# Patient Record
Sex: Female | Born: 1935 | Race: White | Hispanic: No
Health system: Southern US, Community
[De-identification: ages and names within clinical notes are randomized; demographics above are authoritative.]

---

## 2001-03-25 ENCOUNTER — Inpatient Hospital Stay (HOSPITAL_COMMUNITY): Admission: AD | Admit: 2001-03-25 | Discharge: 2001-03-28 | Payer: Self-pay | Admitting: Internal Medicine

## 2001-03-26 ENCOUNTER — Encounter: Payer: Self-pay | Admitting: Internal Medicine

## 2001-03-28 ENCOUNTER — Encounter: Payer: Self-pay | Admitting: Internal Medicine

## 2005-09-03 ENCOUNTER — Emergency Department: Payer: Self-pay | Admitting: General Practice

## 2007-10-30 ENCOUNTER — Inpatient Hospital Stay (HOSPITAL_COMMUNITY): Admission: EM | Admit: 2007-10-30 | Discharge: 2007-11-09 | Payer: Self-pay | Admitting: Emergency Medicine

## 2009-01-31 IMAGING — CR DG CHEST 1V PORT
1 series · 1 of 1 positions shown · non-contrast
Comparison: none

CLINICAL DATA: Fall, chest pain.
 PORTABLE CHEST - 1 VIEW:

[view not recorded]
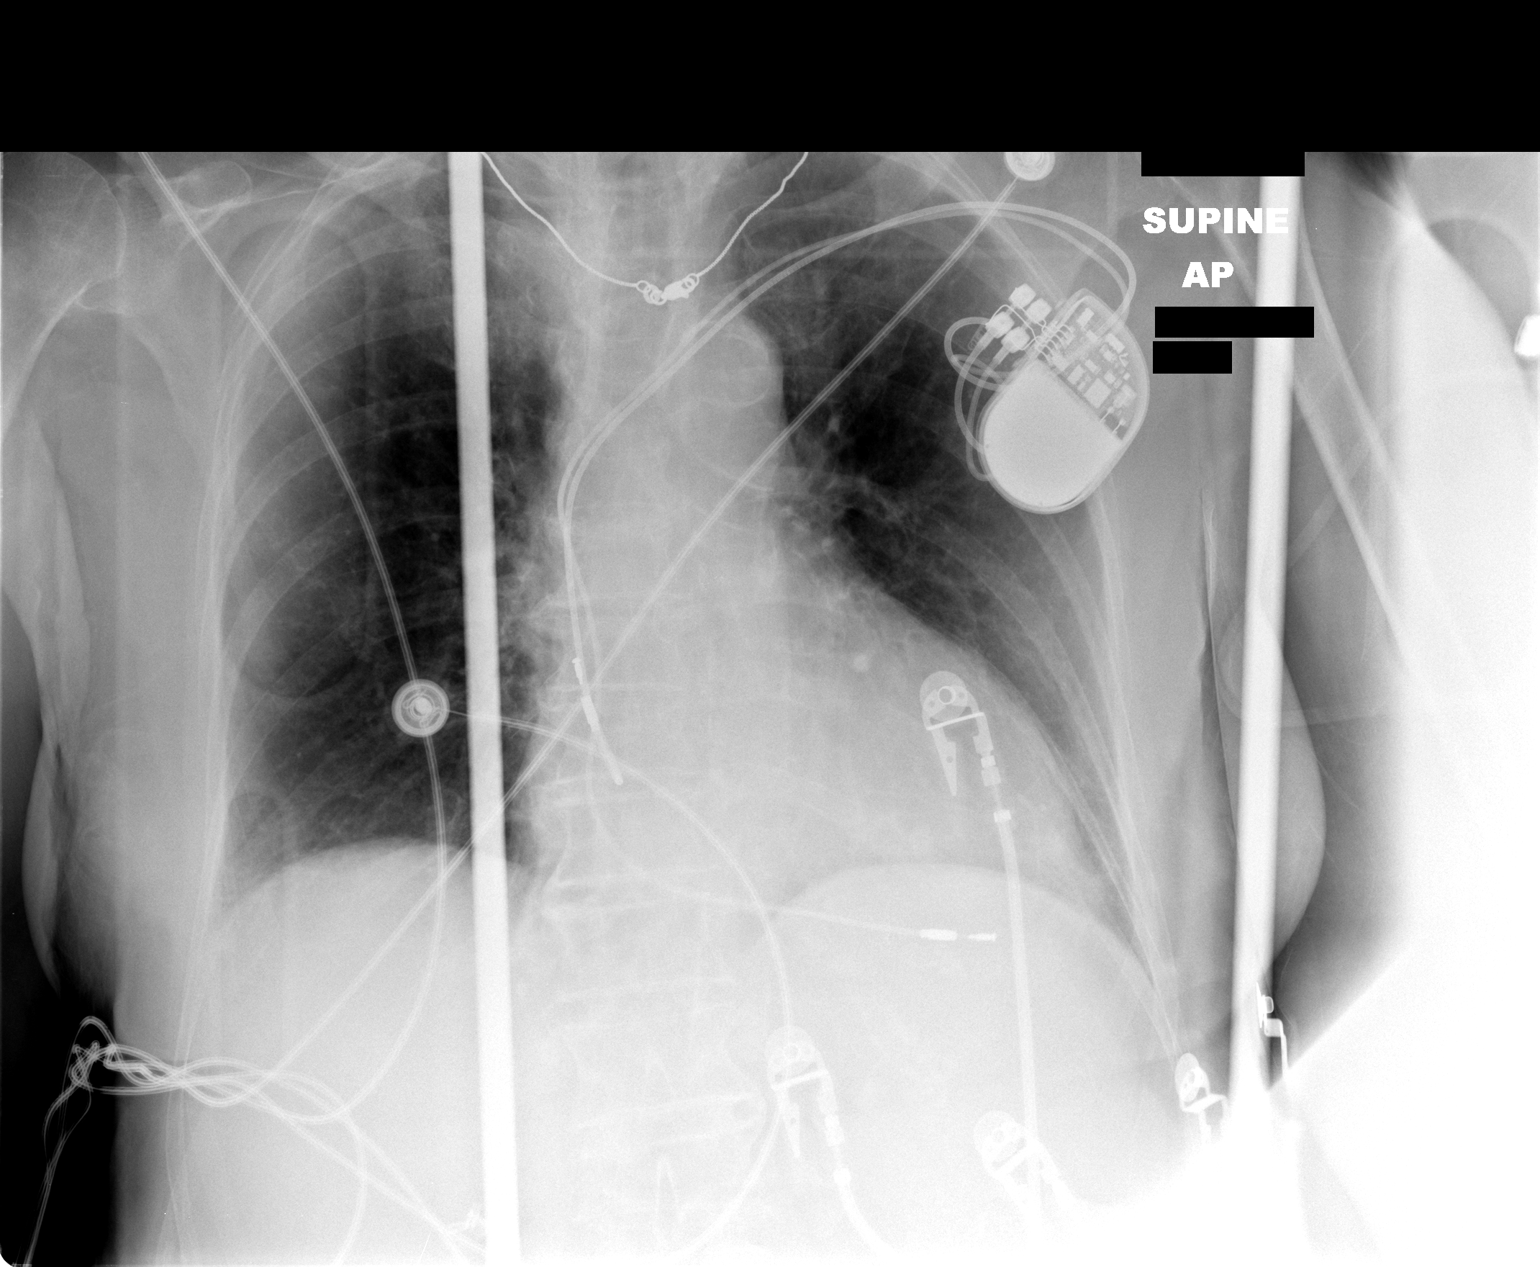

[1 of 1 positions shown; findings below may reference images not displayed]

FINDINGS: Backboard obscures some detail.
 Cardiomegaly is noted. There are fractures of the right 3rd and 4th ribs without definite pneumothorax or pleural effusions. The lungs are otherwise clear. Left-sided pacemaker is noted.
IMPRESSION: 1.  Right 3rd and 4th rib fractures without effusion or definite pneumothorax.
 2.  Cardiomegaly.

## 2009-01-31 IMAGING — CR DG PORTABLE PELVIS
1 series · 1 of 1 positions shown · non-contrast
Comparison: none

CLINICAL DATA: Fall. Pain.
 PORTABLE PELVIS ? 1 VIEW:

[view not recorded]
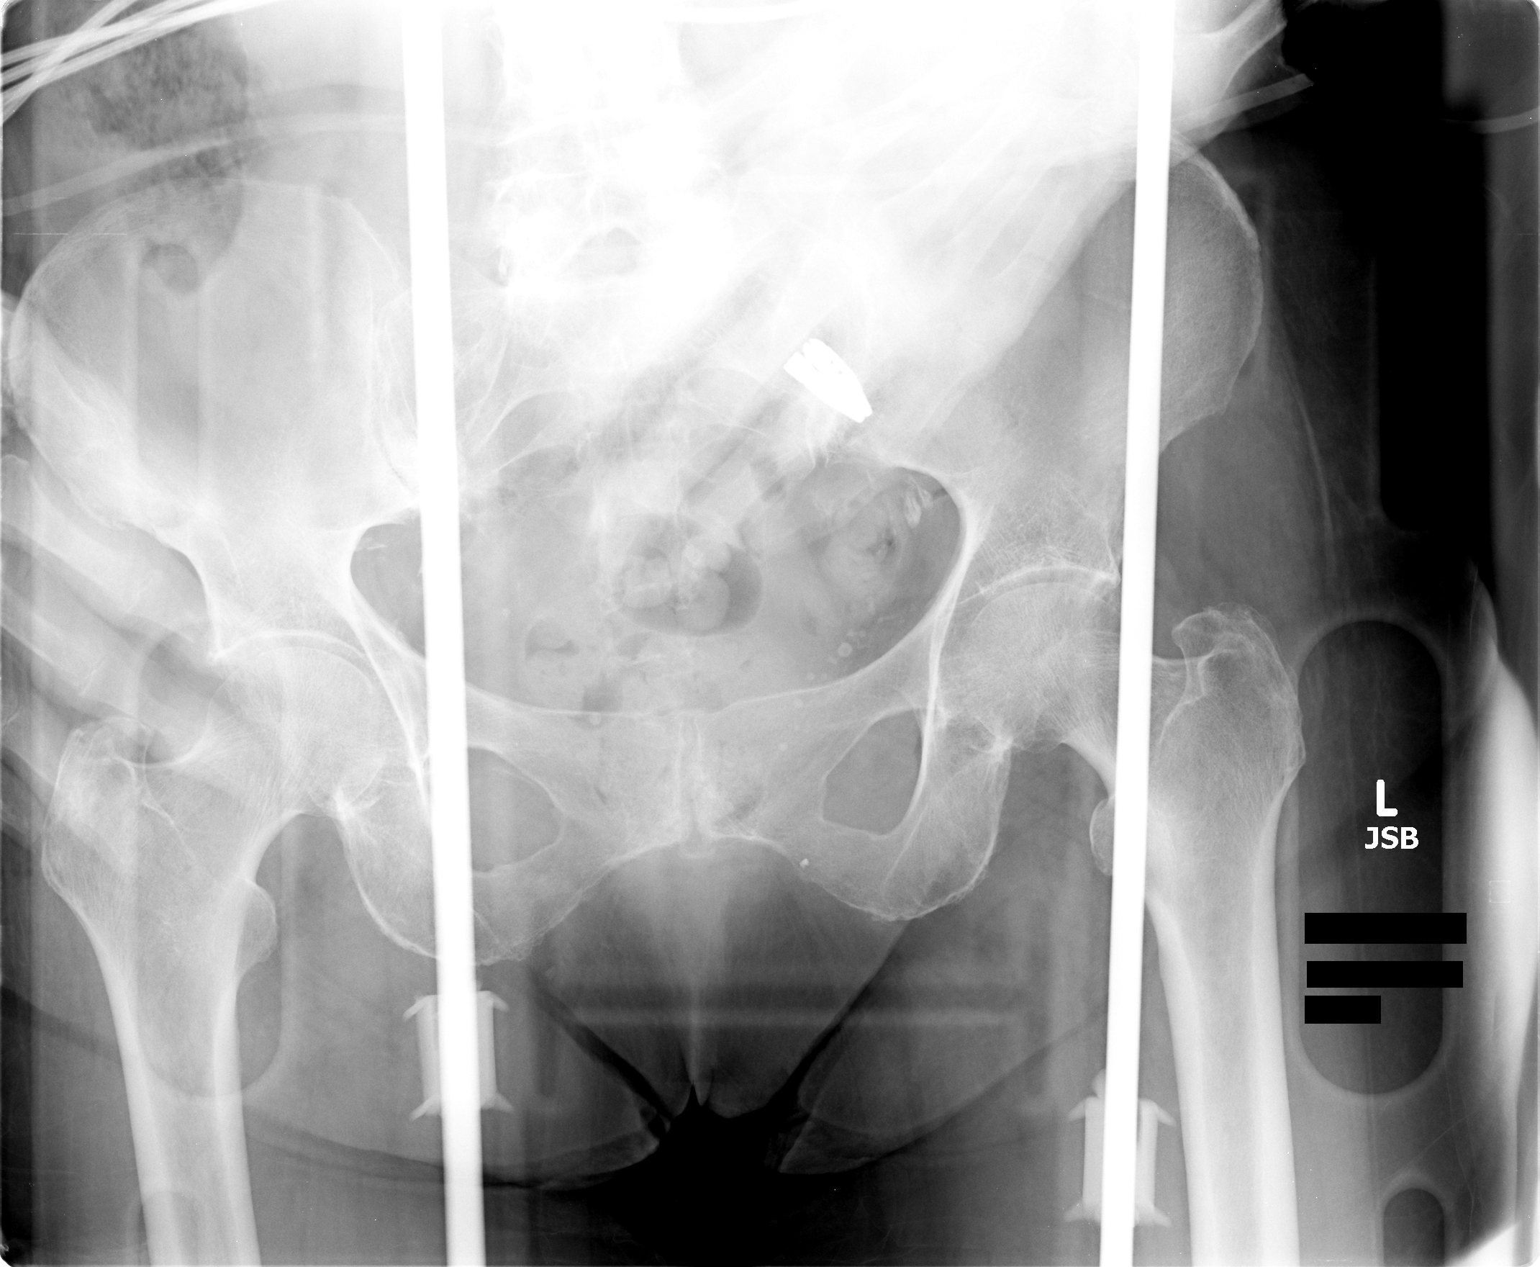

[1 of 1 positions shown; findings below may reference images not displayed]

FINDINGS: No definite acute abnormality identified including fracture, subluxation, or dislocation.  Diffuse osteopenia noted.
IMPRESSION: No acute abnormalities.

## 2009-01-31 IMAGING — CR DG ANKLE 2V *R*
2 series · 2 of 2 positions shown · non-contrast
Comparison: None.

PORTABLE RIGHT ANKLE - 3  VIEW:

CLINICAL DATA: Fall

[view not recorded (1 of 2)]
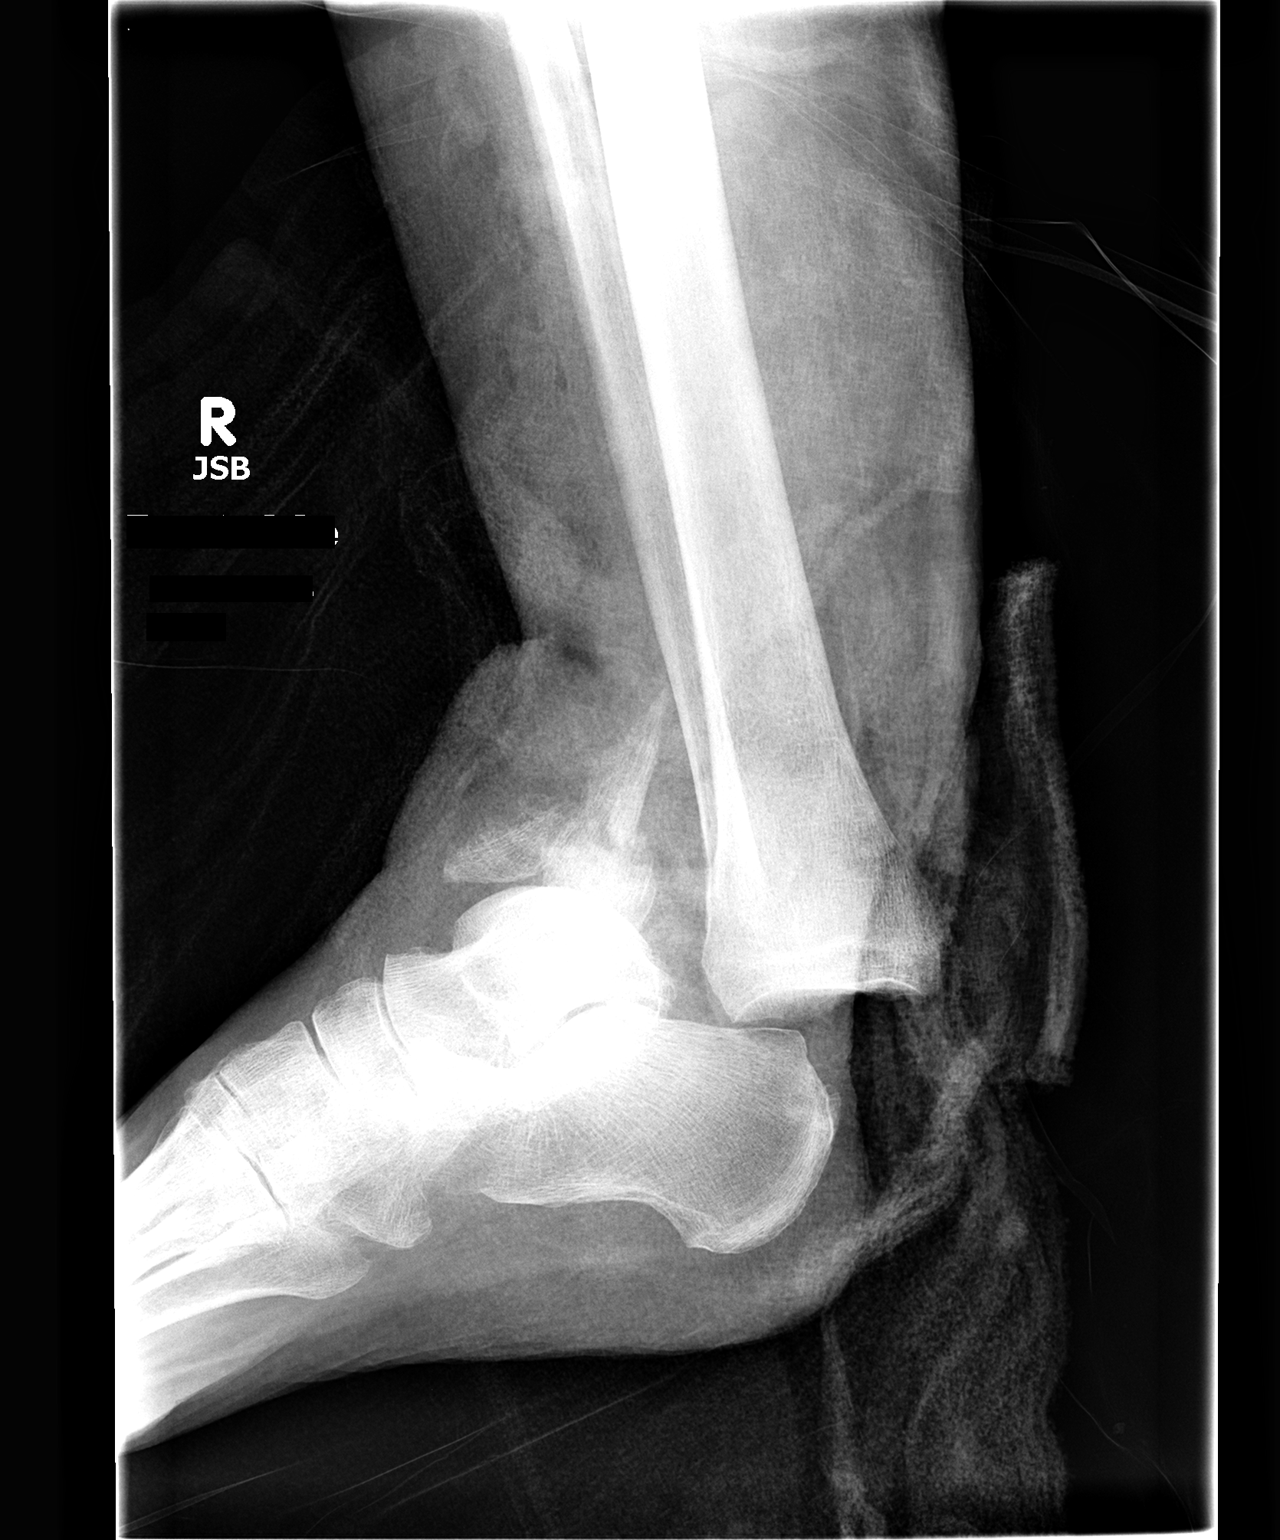

[view not recorded (2 of 2)]
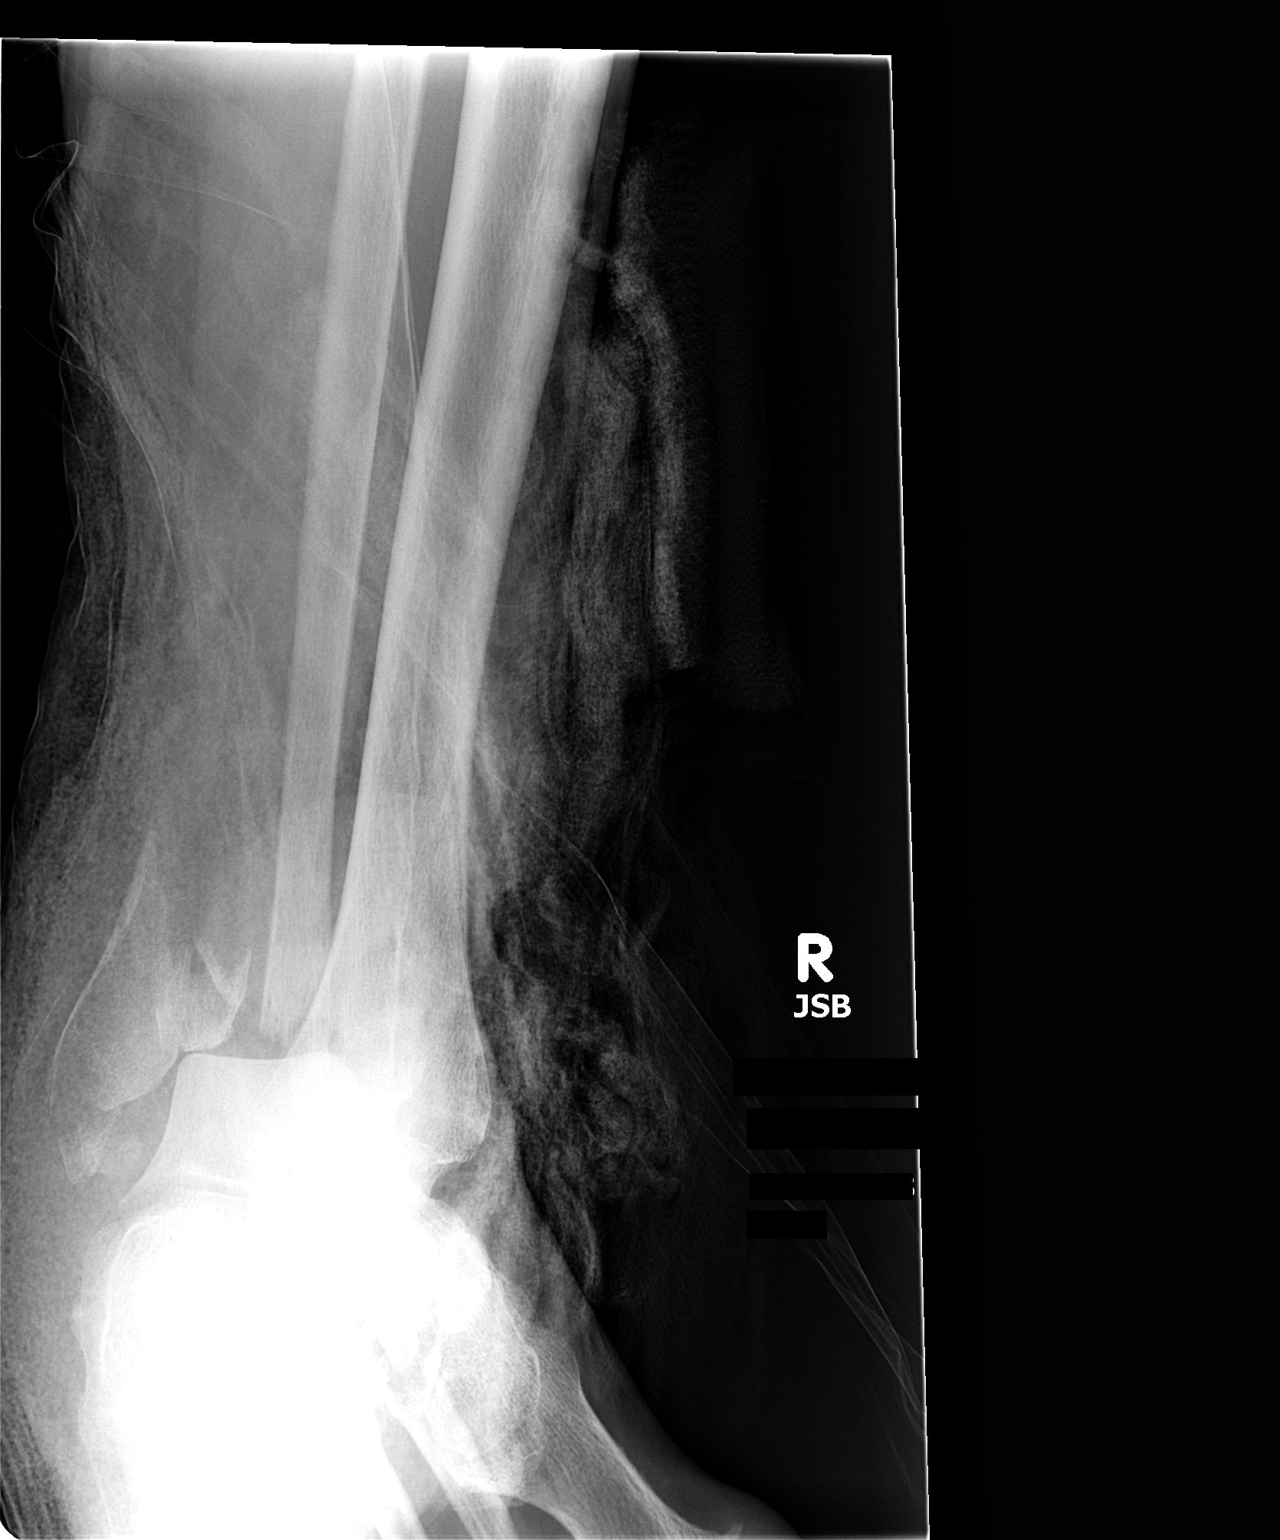

[2 of 2 positions shown; findings below may reference images not displayed]

FINDINGS: There is fracture-dislocation at the ankle with the posterior
dislocation of the tibia and fibula relative to the talus. There are fragments
of the distal tibia and lateral malleolus near their expected anatomic
locations.
IMPRESSION: Limited portable study shows comminuted fracture dislocation of the ankle with
posterior dislocation of the tibia and fibula relative to the talus.

## 2009-02-01 IMAGING — CR DG FOOT COMPLETE 3+V*L*
3 series · 3 of 3 positions shown · non-contrast
Comparison: None.

CLINICAL DATA: Right ankle trimalleolar fracture.  Bruising of the left foot 1st and 2nd digits. 
PORTABLE LEFT FOOT ? 3 VIEW:

[view not recorded (1 of 3)]
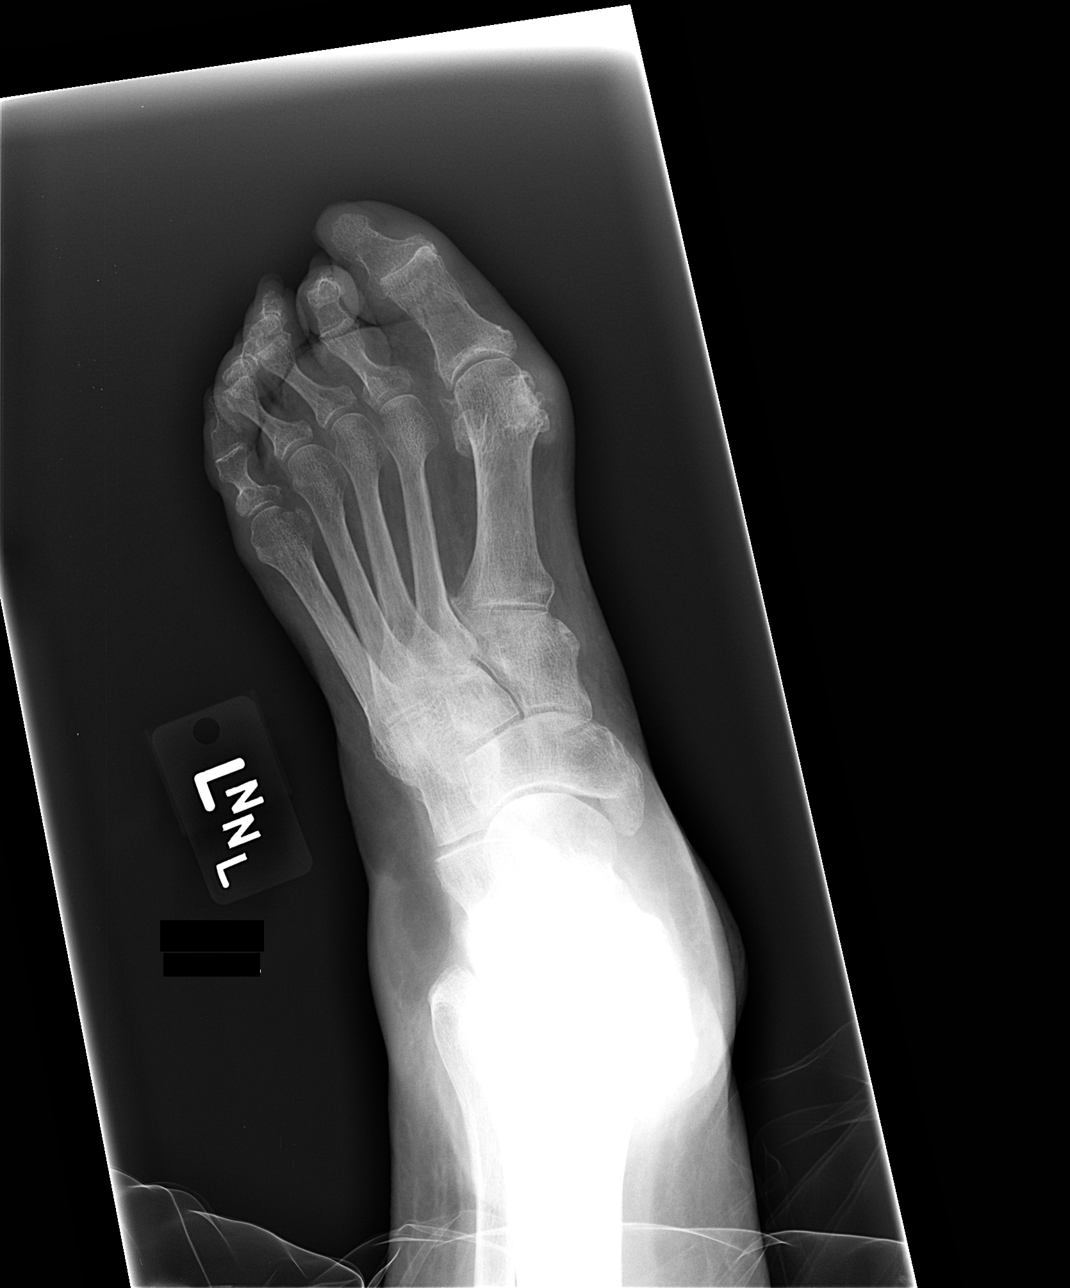

[view not recorded (2 of 3)]
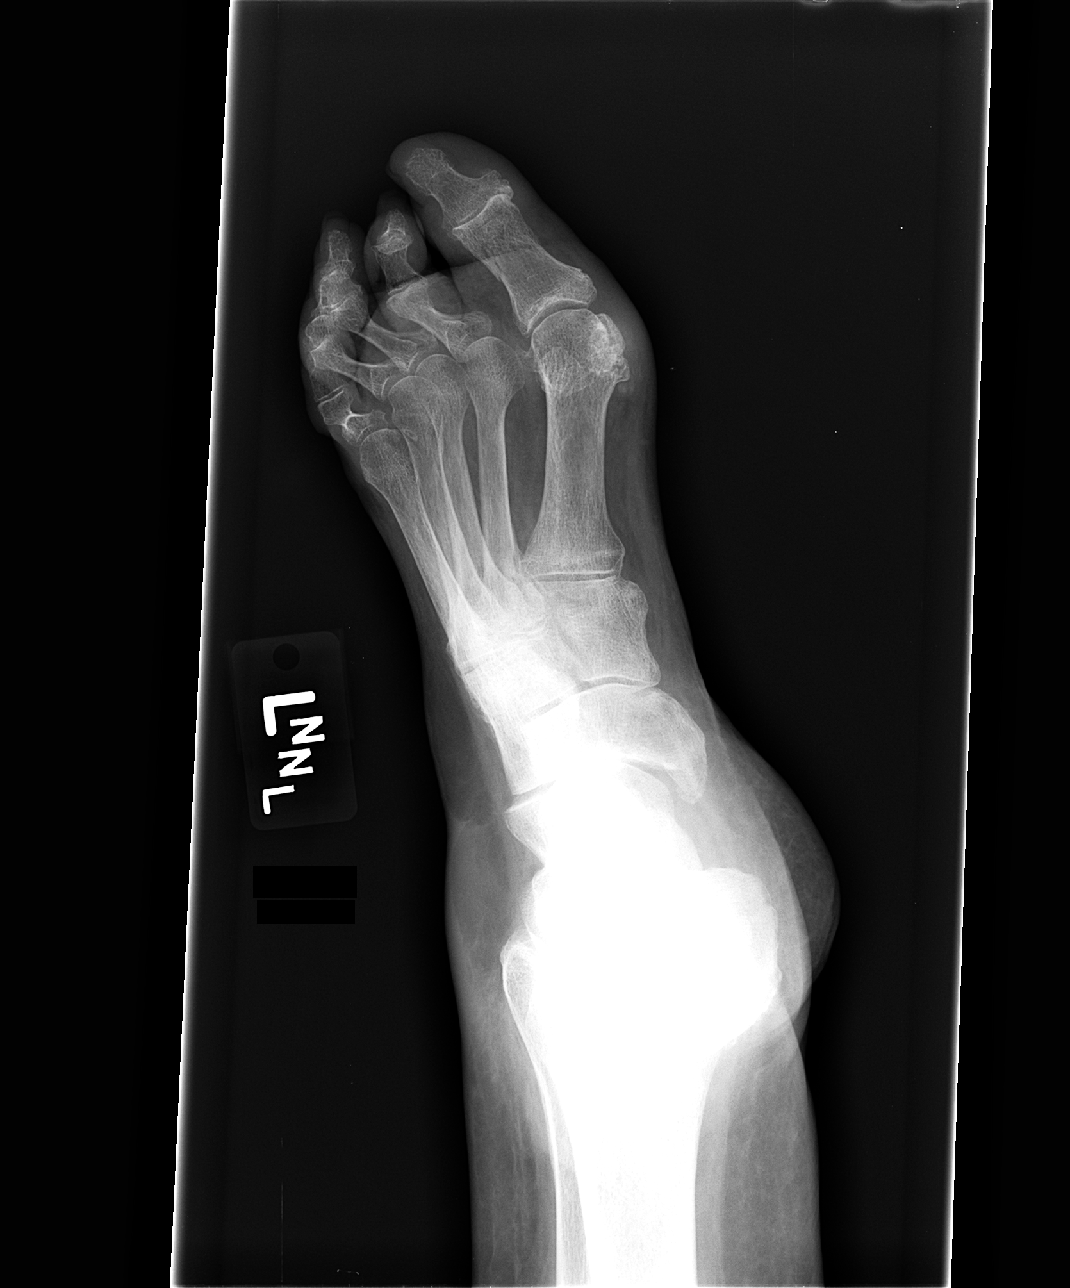

[view not recorded (3 of 3)]
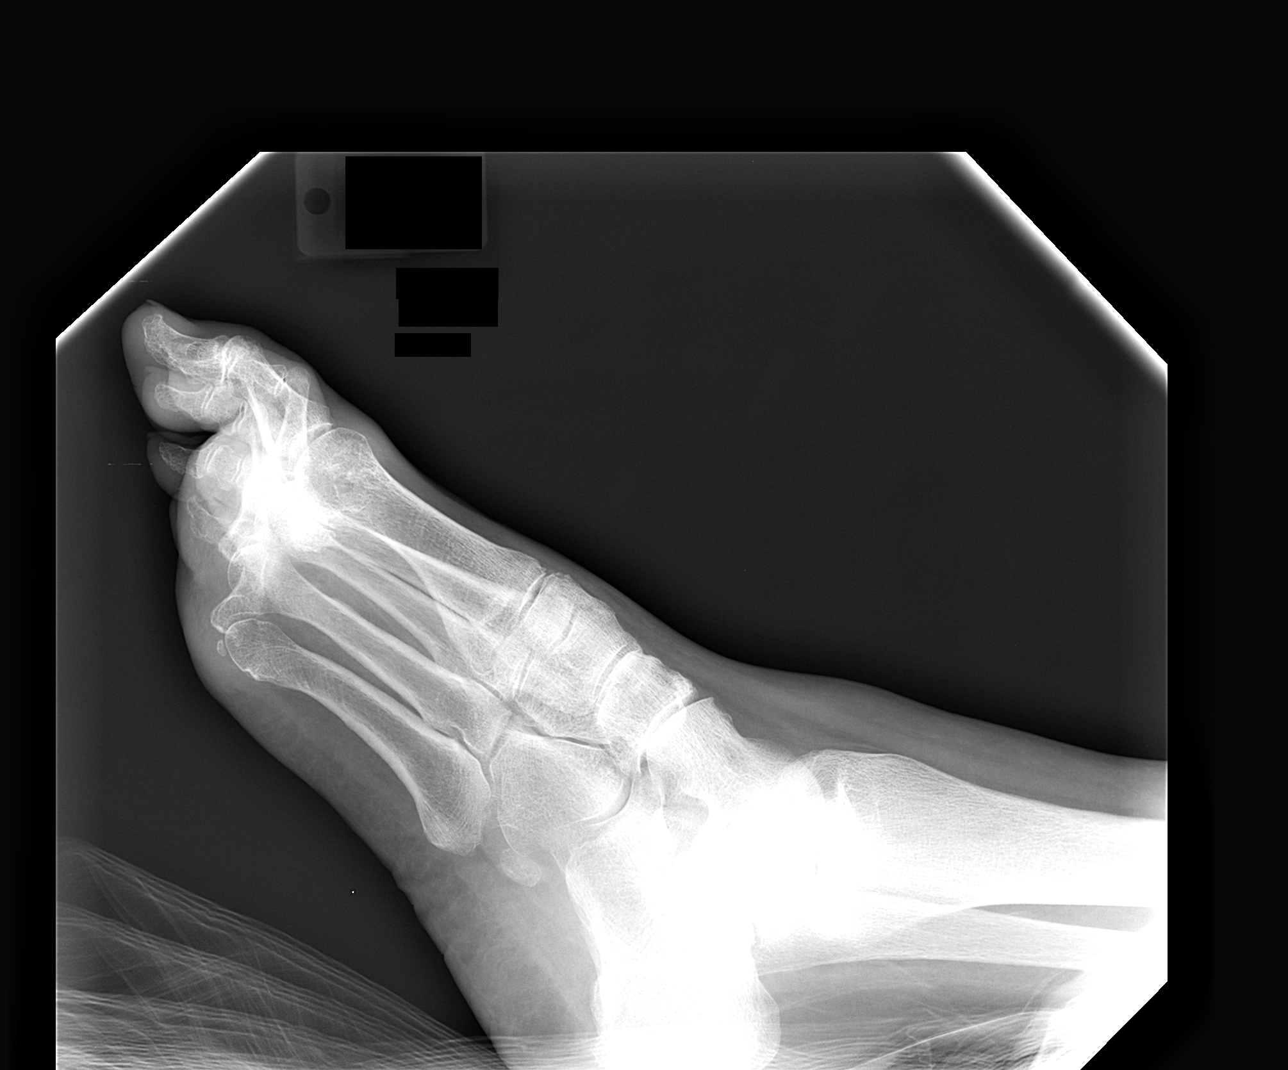

[3 of 3 positions shown; findings below may reference images not displayed]

FINDINGS: Positioning is suboptimal on these portable examinations.  There is soft tissue swelling in the great toe associated with moderate degenerative changes at the 1st metatarsophalangeal joint.  A nondisplaced fracture of the proximal phalanx of the great toe is best seen on the oblique view.  This may extend into the interphalangeal joint.  On the AP view, there is some irregularity of the base of the 2nd proximal phalanx, and a fracture at that site cannot be excluded.  No metatarsal fracture or subluxation of the Lisfranc joint is seen.
IMPRESSION: 1.  Nondisplaced fracture of the proximal phalanx of the great toe.  This may demonstrate interphalangeal joint extension.
2.  Possible fracture of the base of the 2nd proximal phalanx.
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
## 2011-05-10 NOTE — Discharge Summary (Signed)
Teresa Knox, Teresa Knox                  ACCOUNT NO.:  [id number]   MEDICAL RECORD NO.:  [id number]          PATIENT TYPE:  INP   LOCATION:  5007                         FACILITY:  [facility]   PHYSICIAN:  Gabrielle Dare. Janee Morn, M.D.DATE OF BIRTH:  10-29-1908   DATE OF ADMISSION:  10/30/2007  DATE OF DISCHARGE:  11/09/2007                               DISCHARGE SUMMARY   ADMITTING TRAUMA SURGEON:  Dr. Ovidio Kin.   CONSULTANTS:  Dr. Rennis Chris, Orthopedic Surgery.   DISCHARGE DIAGNOSES:  1. Fall down stairs.  2. Open right bimalleolar ankle fracture.  3. Right clavicle fracture.  4. Acute right rib fractures.  5. Old healing rib fractures noted.  6. Hypertension.  7. Sick sinus syndrome with history of pacemaker placement.  8. Hypothyroidism.  9. History of coronary artery disease with history of myocardial      infarction.  10.History of gout.  11.Left foot great toe and second toe fractures, acute.  12.Acute blood loss anemia.  13.History of frequent skin tears.   HISTORY ON ADMISSION:  This is a very pleasant 77 year old white female  who apparently was living by herself.  She fell down approximately 15  steps and presented with an open right ankle fracture.  She was worked  up further at this time with a chest x-ray which showed no active  disease.  She did have a pacemaker in place.  Plain film of the pelvis  was negative for fracture.  Again, she had a bimalleolar open right  ankle fracture with significant displacement.   She has had a recent history of multiple falls and it is felt it is  likely some of her fractures are due to these falls over the last couple  of months, but she was also noted to have some likely acute right rib  fractures, although these were not significantly tender.  A small distal  clavicle fracture was also noted on the right.   CT scan of the head showed no evidence of acute intracranial  abnormality.  She did have soft tissue swelling of the right scalp.   She  had some atrophy and chronic small vessel white matter ischemic changes.  Cervical spine CT showed no evidence of acute injury.  She does have  multilevel degenerative disk disease, spondylosis and facet arthropathy.  CT scan of the chest showed distal right clavicle fracture and multiple  right-sided rib fractures, most of which appeared to be remote, but some  of these may be more acute.  She had some mild cardiomegaly and a small  pericardial effusion.  CT scan of the abdomen and pelvis showed no acute  abnormality.  She did have some atrophy of the left kidney noted.   The patient was admitted.  She was seen in consultation per Dr. Francena Hanly and was taken to the OR on October 31, 2007 for exploration,  irrigation and debridement of severely displaced right grade 3 fracture  dislocation.  There was extensive periosteal stripping of the distal  tibia and complete disassociation of the foot and ankle from the distal  tib-fib.  She underwent ORIF of her bimalleolar ankle fracture and  application of a vacuum-assisted closure device secondary to her severe  soft tissue wounds.  She was initially monitored in the ICU  postoperatively and did extremely well following this.  She did require  transfusion of 2 units of packed red blood cells on October 31, 2007 and  she tolerated this very well as well.  She was started on Coumadin for  DVT/PE prophylaxis and covered with gentamicin and Ancef initially for  her open fracture.  She continued on wound V.A.C. dressing changes and  her wound over her medial ankle continued to granulate in well.  The  proximal wound is nearly granulated in to the skin level.  The distal  portion of the wound does continue to have some slough present and she  is being switched to Hydrogel dressing to the distal wound.  The  proximal wound they are going to utilize Aquacel to the upper leg wound  and the inner suture wound line; this is to be changed every   Sunday/Tuesday/Friday.  They are going to use Hydrogel again to the  lower portion of the wound, which is not granulating in as  significantly.  She is being placed in a PRAFO boot to protect her  fixation and wounds.  She has otherwise had an uneventful hospital  course.  She is on Coumadin for DVT/PE prophylaxis and it is suspected  that she will stabilized on an fairly low dose of a milligram to 2 mg  daily.  She will need continued PT/INR monitoring.   Her last hemoglobin was 9.8 and last hematocrit was 28.6.  Last BUN was  24, creatinine 1.26.  Her last INR was 2; this was this morning on  November 09, 2007.  Again, it is suspected that she will stabilize out  on a milligram to 2 mg daily.  Again, she does need continued monitoring  of her PT/INR.   DIET:  Her diet is soft foods.   MEDICATIONS AT TIME OF DISCHARGE:  1. Coumadin 1 mg to 2 mg daily based upon her INR with INR goal of 2      to 2.5.  2. Elestat 0.5% eye drops, 1 drop, both eyes, b.i.d.  3. Protonix 40 mg p.o. daily.  4. Colace 100 mg p.o. b.i.d.  5. Dulcolax tabs 2 p.o. daily.  6. Atenolol 50 mg p.o. b.i.d.  7. Lasix 40 mg p.o. daily.  8. Altace 10 mg p.o. daily.  9. Synthroid 50 mcg p.o. daily.   ACTIVITY:  She is non-weightbearing on her right lower extremity.  She  is weightbearing as tolerated on the left lower extremity.  She has not  really tolerated any ambulation at this point.  The therapists are  working with her on transfers and she continues to require total assist  for her transfers.   WOUND CARE:  Again, a PRAFO boot is being obtained to protect her wounds  and her wound care is as mentioned above.   DISPOSITION:  At this time, arrangements are being made for the patient  to be transferred to a skilled nursing facility.      Shawn Rayburn, P.A.      Gabrielle Dare Janee Morn, M.D.  Electronically Signed    SR/MEDQ  D:  11/09/2007  T:  11/09/2007  Job:  [id number]   cc:   Vania Rea. Supple,  M.D.  [facility]

## 2011-05-10 NOTE — Op Note (Signed)
[name], Teresa Knox                  ACCOUNT NO.:  [id number]   MEDICAL RECORD NO.:  [id number]          PATIENT TYPE:  INP   LOCATION:  2550                         FACILITY:  [facility]   PHYSICIAN:  Vania Rea. Supple, M.D.  DATE OF BIRTH:  Mar 23, 1908   DATE OF PROCEDURE:  10/31/2007  DATE OF DISCHARGE:                               OPERATIVE REPORT   PREOPERATIVE DIAGNOSES:  Grade 3 open right ankle fracture-dislocation.   POSTOPERATIVE DIAGNOSES:  Grade 3 open right ankle fracture-dislocation.   PROCEDURE:  1. Exploration, irrigation and debridement of severely displaced right      ankle grade 3 fracture dislocation with extensive periosteal      stripping of the distal tibia and complete dissociation of the foot      and ankle from the distal tibia and fibula.  2. Open reduction and internal fixation of left bimalleolar ankle      fracture.  3. Application of a vacuum at assisted closure device.   SURGEON:  Vania Rea. Supple, M.D.   ASSISTANT:  Ralene Bathe, PA-C.   ANESTHESIA:  General endotracheal.   TOURNIQUET TIME:  Approximately 1 hour and 45 minutes.   ESTIMATED BLOOD LOSS:  Minimal.   HISTORY:  [name] is a 77 year old female who has been living  independently at home and unfortunately this evening tripped and fell  down approximately 15 steps injuring her right ankle as well as  sustaining multiple contusions and abrasions primarily to her hands and  forearms.  She had obvious deformity with open wound of the right ankle  and upon presentation to the emergency room was found to have severely  displaced right ankle fracture-dislocation with a large soft tissue  defect and complete dissociation of the foot and ankle from the distal  tibia.  Radiographs confirmed a bimalleolar fracture dislocation.  She  subsequently was evaluated by the trauma service as a silver trauma due  to the severity of her fall and her age.  She had a head, neck, chest,  abdomen and CT scan  which did not show any obvious cervical or  intracranial pathology or other organ injuries.  There did appear to be  a distal clavicle fracture on right.  Degenerative changes were noted  diffusely in the cervical spine. She is subsequently cleared for surgery  for emergent treatment of her severe open ankle fracture-dislocation.   Preoperatively, I counseled Ms. Suazo on treatment options as well as  risks versus benefits thereof.  The possible complications of bleeding,  infection, neurovascular injury, DVT, malunion, nonunion, loss of  fixation and possible need for additional surgery were all reviewed.  She understands and accepts and agrees with our planned procedure.   PROCEDURE IN DETAIL:  After undergoing extensive preop evaluation and  clearance, the patient was brought to the operating room and placed  supine on the operating table and underwent smooth induction of a  general endotracheal anesthesia.  Appropriately padded and protected.  Tourniquet applied to the right side.  The splint was removed from the  right lower extremity revealing the traumatic wound. The  right lower  extremity was then sterilely prepped and draped in standard fashion.  The foot and ankle were redislocated from the distal tibia to allow  complete exposure of all potentially contaminated surfaces.  Sharp  dissection was used to remove all devitalized and obviously necrotic  tissues.  We used the pulsatile lavage to meticulously clean all soft  tissue and bony surfaces including the articular surfaces of the distal  tibia and talus.  We then carefully reduced the talus beneath the tibia  and made sure that the posteromedial tenderness and neurovascular  structures were appropriately padded and brought back into proper  position.  We did find a relatively large medial malleolar fracture  fragment which was well fixated to the deltoid ligament and the talus.  We then made a longitudinal incision laterally  and again the medial  wound had a transverse and longitudinal component transverse at the  level of the ankle mortise anteromedially approximately 8 cm in length  and then a longitudinal segment approximately 15 cm in length over the  medial tibial shaft.  All of these wounds were meticulously debrided.  The ankle was reduced.  We then turned our attention laterally where a  10 cm longitudinal incision was made over the distal fibula.  Dissection  carried down to the fibula shaft. The fibular fracture was comminuted  with a coronal fracture through the distal tip of the fibula and then an  oblique fracture more proximally.  We reapproximated bony fragments and  then placed a seven hole one-third tubular locking plate over the  posterior margin of the distal fibula and gained overall realignment and  length.  We then placed a single transfixion screw from anterior to  posterior across the coronal fracture fragment distally and regaining  overall good bony alignment.  We then turned our attention medially  where we used two 4.0 cannulated screws to transfix the medial malleolus  and this did obtain good bony purchase and good overall alignment.  We  then obtained repeat fluoroscopic images which showed good alignment  over the fracture sites and the talus to be symmetrically aligned within  the tibial plafond.  At this point we then closed the lateral wound with  2-0 Vicryl subcu and 2-0 nylon vertical mattress sutures for the skin.  Medially we loosely reapproximated the large skin flaps and then applied  a VAC (vacuum-assisted closure) device for treatment of the large open  wound.  We then placed a dry dressing over the lateral incision.  We  then fabricated a very well-padded posterior splint with the ankle in  neutral position.  At this point, the tourniquet was then let down.  The  patient was extubated and taken to the recovery room in stable  condition.      Vania Rea. Supple,  M.D.  Electronically Signed     KMS/MEDQ  D:  10/31/2007  T:  10/31/2007  Job:  [id number]

## 2011-05-13 NOTE — Op Note (Signed)
[facility]  Patient:    Teresa Knox, Teresa Knox                           MRN: [id number] Proc. Date: 03/27/01 Attending:  Nathen May, M.D., [facility] CC:         [place] [facility], attention Delsa Grana   Operative Report  PREOPERATIVE DIAGNOSIS:  Bradycardia-associated polymorphic ventricular tachycardia, possible long QT syndrome.  POSTOPERATIVE DIAGNOSIS:  Bradycardia-associated polymorphic ventricular tachycardia, possible long QT syndrome.  PROCEDURES:  Contrast venography, implantation of a dual-chamber pacemaker.  DESCRIPTION OF PROCEDURE:  Following the obtaining of informed consent, the patient was brought to the electrophysiology laboratory and placed on the fluoroscopic table in the supine position.  After routine prep and drape of the left upper chest, intravenous contrast was injected via the left antecubital vein to identify the course and the patency of the extrathoracic left subclavian vein.  This having been accomplished, lidocaine was infiltrated in the prepectoral subclavicular region, and an incision was made and carried down to the layer of the prepectoral fascia using electrocautery. A pocket was formed using electrocautery.  Hemostasis was obtained.  Thereafter, attention was turned to gaining access to the extrathoracic left subclavian vein, which was accomplished with moderate difficulty but without the aspiration of air or puncture of the artery.  Two separate venipunctures were ultimately accomplished, and guidewires were placed and allowed to hang loosely.  A 0 silk suture was placed in a figure-of-eight fashion and allowed to hang loosely.  Sequentially an 8 [place] and 9 [place] tear-away introducer sheath were placed, through which were passed.  Thereafter, a Pacesetter 1346T, 52 cm length passive-fixation ventricular lead, serial number [id number], and a Pacesetter 1342, 46 cm passive-fixation atrial lead, serial number  [id number], were passed under fluoroscopic guidance to the right ventricular apex and the right atrial appendage, respectively, with a bipolar R-wave of 10 millivolts and a pacing impedance of 700 Ohms, a pacing threshold of 0.5 milliseconds at 0.7 volts, with a currented threshold at 0.5 MA, and there was no diaphragmatic pacing at 10 volts.  The bipolar P-wave was 5 millivolts with a pacing impedance of 300 Ohms and a pacing threshold of 0.7 volts at 0.5 millisecond.  Currented threshold was also 2.2 Ohms in the atrium and 0.9 Ohms in the ventricle, and there was no diaphragmatic pacing at 10 volts.  With these data clarified, these leads were secured to the prepectoral fascia and then attached to a Guidant Discovery II model 1284 pulse generator, serial number [id number].  P-synchronous pacing with pseudofusion was identified.  The leads were secured to the prepectoral fascia, and the pocket was copiously irrigated with antibiotic-containing saline solution, and hemostasis was assured, and the leads and the pulse generator were then placed in the pocket, secured to the prepectoral fascia.  The wound was then closed in three layers in normal fashion.  The wound was washed, dried, and a benzoin and Steri-Strip dressing was then applied.  Needle counts, sponge counts, and instrument counts were correct at the end of the procedure according to the staff.  The patient tolerated the procedure well without apparent complications. DD:  03/27/01 TD:  03/27/01 Job: [id number]

## 2011-05-13 NOTE — H&P (Signed)
[place]  Patient:    Teresa Knox, Teresa Knox                           MRN: [id number] Adm. Date:  03/25/01 Attending:  Luis Abed, M.D. [facility] Dictator:   Abelino Derrick, P.A.C. [facility]                         History and Physical  HISTORY OF PRESENT ILLNESS:  Ms. Fessenden is a 77 year old female followed by Dr. Yetta Flock in [place].  She was admitted to [facility], March 22, 2001, with syncopal spells.  She had runs of torsade which were witnessed; unfortunately, no strips were obtained.  She was seen and counseled by Dr. Sherril Croon.  Initially, she had been put on lidocaine but developed some bradycardia into the 40s and some junctional rhythm, and this was discontinued.  Dr. Sherril Croon also discontinued her Lanoxin and clonidine which she was on prior to admission.  She has ruled out for an MI.  A 2D echocardiogram revealed a preserved LV function with MR and AR.  She was transferred to [facility] for EP evaluation.  PAST MEDICAL HISTORY:  Remarkable for gout, treated hypertension, DJD.  She has a previous history of sick sinus syndrome.  She is status post cholecystectomy, hysterectomy, appendectomy, total right knee.  MEDICATIONS:  Her medications at home are Lanoxin 0.125 q day, clonidine q day, Synthroid 0.05 mg a day, Vasotec 5 mg a day, Lasix 20 mg a day.  ALLERGIES:  She is intolerant to CODEINE.  SOCIAL HISTORY:  She is a widow, lives with her son.  FAMILY HISTORY:  Unremarkable.  REVIEW OF SYSTEMS:  She has had recent bronchitis and episodic weakness with exertion but denies any typical anginal symptoms.  PHYSICAL EXAMINATION:  VITAL SIGNS:  Blood pressure is 120/60, pulse 58, O2 saturation is 94% on room air.  GENERAL:  She is a well-developed, well-nourished elderly female in no acute distress.  HEENT:  Normocephalic.  Extraocular movements are intact.  Sclerae are nonicteric.  NECK:  Without JVD, without bruits.  CHEST:  Clear to auscultation  and percussion.  CARDIAC:  Exam reveals a regular rate and rhythm with a 2/6 systolic murmur at the left sternal border in the apex.  Normal S1, S2.  ABDOMEN:  Nontender.  No hepatosplenomegaly.  EXTREMITIES:  Without edema.  Distal pulses are 2+/4 bilaterally.  NEUROLOGIC:  Grossly intact.  She is awake, alert, oriented, and cooperative.  ANCILLARY DATA:  Labs from [facility]:  White count 8.6, hemoglobin 13.6, hematocrit 39.3, platelet count 260.  Urinalysis unremarkable.  Urine culture negative.  Sodium 141, potassium 3.7, BUN 18, creatinine 1.3, magnesium level was 2.1.  Liver functions are normal.  CK-MB and troponins were negative x 3.  Lanoxin level 1.16, TSH 2.70, INR 1.07.  Chest x-ray reveals some opacities in the bases consistent with either pneumonia or heart failure.  Echocardiogram revealed concentric LVH, normal LV function with an EF of 75% with moderate to moderate to severe MR and moderate AR.  There is also evidence of pulmonary hypertension.  PA pressure was about 60 mmHg.  Her EKG showed sinus bradycardia, QTc of 568.  IMPRESSION: 1. Syncope with torsade and sick sinus syndrome. 2. Hypertension. 3. Treated hypothyroidism. 4. MR and AR, appear to be compensated. 5. History of gout. 6. DJD.  PLAN:  The patient is admitted to telemetry.  She will be continued on her ACE inhibitor, thyroid replacement, and Lasix 20 mg.  She will be evaluated by the EP service for pacemaker and possibly EP study. DD:  03/25/01 TD:  03/25/01 Job: [id number]

## 2011-05-13 NOTE — Discharge Summary (Signed)
[place]. [facility]  Patient:    Teresa Knox, Teresa Knox                    MRN: [id number] Adm. Date:  [id number] Disc. Date: 03/28/01 Attending:  Lewayne Bunting Dictator:   Chinita Pester, N.P. CC:         Harl Bowie, M.D., [place], [place]  Nathen May, M.D., [facility]  Dr. Shanon Rosser, [place]   Discharge Summary  PRIMARY DIAGNOSIS:  Syncope.  HISTORY OF PRESENT ILLNESS:  This is a 77 year old female followed by Dr. Yetta Flock in [place], [place].  She was admitted to [facility] on March 22, 2001, with syncopal spells. She had runs of torsades which were witnessed; unfortunately, no strips were obtained. She was seen in counsel by Dr. Sherril Croon.  Initially she had been put on lidocaine but developed bradycardia and rate in the 40s and some junctional rhythm.  This was discontinued.  Dr. Sherril Croon also discontinued her digoxin her clonidine which she was on prior to admission.  She had ruled out for an MI.  A 2D echo revealed preserved LV function with MR and AR.  She was transferred to [facility] for EP evaluation.  PAST MEDICAL HISTORY:  Positive for gout, hypertension, DJD, previous sick sinus syndrome, status post cholecystectomy, hysterectomy, appendectomy, and total right knee.  HOSPITAL COURSE:  The patient was admitted with syncope and bradycardia.  She underwent placement of a Electronics engineer II pacemaker.  She tolerated the procedure well, had no postop complications. Chest x-ray leads were in proper position.  Thresholds and HO amplitude was adjusted by Elwyn Reach postoperatively.  DISCHARGE MEDICATIONS:  The patient was discharged to home on: 1. Atenolol 50 daily. 2. Vasotec, the patient states she was taking 20 mg daily at home.  She was    to continue the same dose. 3. Synthroid 50 mcg daily. 4. Lasix 20 daily. 5. Coated aspirin 81 daily. 6. Imdur as a new medication 30 mg daily. 7. Colchicine 0.6 mg  daily. The patient states she has that at home. 8. Darvocet-N 100 p.o. every six hours as needed for pain. The patient    was given a prescription for five pills for her gout pain.  ACTIVITY:  As tolerated.  No heavy lifting or strenuous activity with her left arm for six to eight weeks.  DIET:  Low fat, low cholesterol, low salt diet.  WOUND CARE:  She is to keep her incision clean and dry.  No shower for a week. She was not to raise her left arm above her head for one week.  She was to gradually raise as instructed.  FOLLOW-UP:  She is to have a BMET within one week.  She is to follow up with Harl Bowie, M.D., within 10 days and Nathen May, M.D., [facility], in July.  She was supplied with the office number to schedule the appointment in mid June.  The office is aware but the patient is to check into June for that appointment. DD:  03/28/01 TD:  03/28/01 Job: [id number]

## 2011-06-26 DEATH — deceased

## 2011-10-04 LAB — PROTIME-INR
INR: 1.5
INR: 2 — ABNORMAL HIGH
INR: 2.3 — ABNORMAL HIGH
INR: 2.3 — ABNORMAL HIGH
INR: 2.8 — ABNORMAL HIGH
Prothrombin Time: 15
Prothrombin Time: 18.8 — ABNORMAL HIGH
Prothrombin Time: 21.8 — ABNORMAL HIGH
Prothrombin Time: 22.9 — ABNORMAL HIGH
Prothrombin Time: 30.4 — ABNORMAL HIGH

## 2011-10-04 LAB — POCT I-STAT CREATININE
Creatinine, Ser: 1.3 — ABNORMAL HIGH
Operator id: 294341

## 2011-10-04 LAB — BASIC METABOLIC PANEL
BUN: 24 — ABNORMAL HIGH
BUN: 30 — ABNORMAL HIGH
CO2: 22
Calcium: 8.5
Chloride: 108
Creatinine, Ser: 1.24 — ABNORMAL HIGH
GFR calc non Af Amer: 29 — ABNORMAL LOW
GFR calc non Af Amer: 40 — ABNORMAL LOW
Glucose, Bld: 103 — ABNORMAL HIGH
Potassium: 4.4
Potassium: 4.9
Potassium: 4.9
Sodium: 133 — ABNORMAL LOW
Sodium: 137

## 2011-10-04 LAB — URINALYSIS, ROUTINE W REFLEX MICROSCOPIC
Glucose, UA: NEGATIVE
Nitrite: NEGATIVE
Protein, ur: NEGATIVE

## 2011-10-04 LAB — CBC
HCT: 28.6 — ABNORMAL LOW
HCT: 28.6 — ABNORMAL LOW
HCT: 29.6 — ABNORMAL LOW
HCT: 31.9 — ABNORMAL LOW
Hemoglobin: 10 — ABNORMAL LOW
Hemoglobin: 10.7 — ABNORMAL LOW
Hemoglobin: 8.4 — ABNORMAL LOW
Hemoglobin: 9.6 — ABNORMAL LOW
MCHC: 33.1
MCHC: 33.5
MCV: 82.5
MCV: 82.6
MCV: 84.3
Platelets: 167
Platelets: 227
RBC: 3.09 — ABNORMAL LOW
RBC: 3.47 — ABNORMAL LOW
RBC: 3.52 — ABNORMAL LOW
RDW: 15.1 — ABNORMAL HIGH
RDW: 15.8 — ABNORMAL HIGH
RDW: 15.9 — ABNORMAL HIGH
WBC: 10.6 — ABNORMAL HIGH
WBC: 14 — ABNORMAL HIGH
WBC: 9.6

## 2011-10-04 LAB — CROSSMATCH
ABO/RH(D): A POS
Antibody Screen: NEGATIVE

## 2011-10-04 LAB — SAMPLE TO BLOOD BANK

## 2011-10-04 LAB — URINE CULTURE: Colony Count: NO GROWTH

## 2011-10-04 LAB — I-STAT 8, (EC8 V) (CONVERTED LAB)
BUN: 41 — ABNORMAL HIGH
Bicarbonate: 20.7
HCT: 28 — ABNORMAL LOW
Operator id: 294341

## 2011-10-04 LAB — ABO/RH: ABO/RH(D): A POS

## 2011-10-04 LAB — TSH: TSH: 1.28

## 2014-10-15 ENCOUNTER — Telehealth: Payer: Self-pay

## 2014-10-15 NOTE — Telephone Encounter (Signed)
Patient died per Social Security Death Site °
